# Patient Record
Sex: Female | Born: 1965 | Race: White | Hispanic: No | Marital: Married | Smoking: Former smoker
Health system: Southern US, Community
[De-identification: ages and names within clinical notes are randomized; demographics above are authoritative.]

## PROBLEM LIST (undated history)

## (undated) DIAGNOSIS — E559 Vitamin D deficiency, unspecified: Secondary | ICD-10-CM

## (undated) DIAGNOSIS — E611 Iron deficiency: Secondary | ICD-10-CM

## (undated) DIAGNOSIS — R928 Other abnormal and inconclusive findings on diagnostic imaging of breast: Secondary | ICD-10-CM

## (undated) HISTORY — DX: Other abnormal and inconclusive findings on diagnostic imaging of breast: R92.8

## (undated) HISTORY — DX: Iron deficiency: E61.1

## (undated) HISTORY — DX: Vitamin D deficiency, unspecified: E55.9

## (undated) HISTORY — PX: WISDOM TOOTH EXTRACTION: SHX21

---

## 2006-08-12 ENCOUNTER — Ambulatory Visit: Payer: Self-pay

## 2010-12-04 ENCOUNTER — Ambulatory Visit: Payer: Self-pay | Admitting: Obstetrics and Gynecology

## 2012-01-20 ENCOUNTER — Ambulatory Visit: Payer: Self-pay | Admitting: Obstetrics and Gynecology

## 2012-01-21 ENCOUNTER — Ambulatory Visit: Payer: Self-pay | Admitting: Obstetrics and Gynecology

## 2012-06-22 ENCOUNTER — Telehealth: Payer: Self-pay | Admitting: *Deleted

## 2012-06-22 NOTE — Telephone Encounter (Signed)
Left msg on home vm for patient to call office for results

## 2012-06-22 NOTE — Telephone Encounter (Signed)
Disregard prior note posted

## 2012-12-31 ENCOUNTER — Emergency Department: Payer: Self-pay | Admitting: Emergency Medicine

## 2014-03-22 LAB — LIPID PANEL
Cholesterol: 172 mg/dL (ref 0–200)
HDL: 48 mg/dL (ref 35–70)
LDL Cholesterol: 103 mg/dL
Triglycerides: 106 mg/dL (ref 40–160)

## 2014-03-22 LAB — TSH: TSH: 1.43 u[IU]/mL (ref <=5.90)

## 2014-03-22 LAB — HM PAP SMEAR: HM PAP: NEGATIVE

## 2014-03-22 LAB — HEMOGLOBIN A1C: Hgb A1c MFr Bld: 5.6 % (ref 4.0–6.0)

## 2014-04-24 ENCOUNTER — Ambulatory Visit: Payer: Self-pay | Admitting: Obstetrics and Gynecology

## 2014-05-08 ENCOUNTER — Ambulatory Visit: Payer: Self-pay | Admitting: Obstetrics and Gynecology

## 2014-11-27 ENCOUNTER — Other Ambulatory Visit: Payer: Self-pay | Admitting: Obstetrics and Gynecology

## 2015-03-18 ENCOUNTER — Encounter: Payer: Self-pay | Admitting: *Deleted

## 2015-03-26 ENCOUNTER — Ambulatory Visit (INDEPENDENT_AMBULATORY_CARE_PROVIDER_SITE_OTHER): Payer: BLUE CROSS/BLUE SHIELD | Admitting: Obstetrics and Gynecology

## 2015-03-26 ENCOUNTER — Encounter: Payer: Self-pay | Admitting: Obstetrics and Gynecology

## 2015-03-26 VITALS — BP 120/75 | HR 77 | Wt 143.9 lb

## 2015-03-26 DIAGNOSIS — D649 Anemia, unspecified: Secondary | ICD-10-CM

## 2015-03-26 DIAGNOSIS — Z01419 Encounter for gynecological examination (general) (routine) without abnormal findings: Secondary | ICD-10-CM

## 2015-03-26 DIAGNOSIS — E559 Vitamin D deficiency, unspecified: Secondary | ICD-10-CM

## 2015-03-26 NOTE — Patient Instructions (Signed)
  Place annual gynecologic exam patient instructions here.  Thank you for enrolling in MyChart. Please follow the instructions below to securely access your online medical record. MyChart allows you to send messages to your doctor, view your test results, manage appointments, and more.   How Do I Sign Up? 1. In your Internet browser, go to [employer] and enter [email or web address]. 2. Click on the Sign Up Now link in the Sign In box. You will see the New Member Sign Up page. 3. Enter your MyChart Access Code exactly as it appears below. You will not need to use this code after you've completed the sign-up process. If you do not sign up before the expiration date, you must request a new code.  MyChart Access Code: [email or web address] Expires: 05/25/2015  9:29 AM  4. Enter your Social Security Number ([id number]) and Date of Birth (mm/dd/yyyy) as indicated and click Submit. You will be taken to the next sign-up page. 5. Create a MyChart ID. This will be your MyChart login ID and cannot be changed, so think of one that is secure and easy to remember. 6. Create a MyChart password. You can change your password at any time. 7. Enter your Password Reset Question and Answer. This can be used at a later time if you forget your password.  8. Enter your e-mail address. You will receive e-mail notification when new information is available in MyChart. 9. Click Sign Up. You can now view your medical record.   Additional Information Remember, MyChart is NOT to be used for urgent needs. For medical emergencies, dial 911.

## 2015-03-27 LAB — COMPREHENSIVE METABOLIC PANEL
ALBUMIN: 4.4 g/dL (ref 3.5–5.5)
ALT: 19 IU/L (ref 0–32)
AST: 21 IU/L (ref 0–40)
Albumin/Globulin Ratio: 2.3 (ref 1.1–2.5)
Alkaline Phosphatase: 50 IU/L (ref 39–117)
BILIRUBIN TOTAL: 0.3 mg/dL (ref 0.0–1.2)
BUN / CREAT RATIO: 13 (ref 9–23)
BUN: 11 mg/dL (ref 6–24)
CO2: 24 mmol/L (ref 18–29)
Calcium: 9.3 mg/dL (ref 8.7–10.2)
Chloride: 100 mmol/L (ref 97–106)
Creatinine, Ser: 0.85 mg/dL (ref 0.57–1.00)
GFR calc non Af Amer: 82 mL/min/{1.73_m2} (ref >59)
GFR, EST AFRICAN AMERICAN: 94 mL/min/{1.73_m2} (ref >59)
GLOBULIN, TOTAL: 1.9 g/dL (ref 1.5–4.5)
Glucose: 65 mg/dL (ref 65–99)
POTASSIUM: 4.1 mmol/L (ref 3.5–5.2)
Sodium: 139 mmol/L (ref 136–144)
Total Protein: 6.3 g/dL (ref 6.0–8.5)

## 2015-03-27 LAB — LIPID PANEL
Chol/HDL Ratio: 3.5 ratio units (ref 0.0–4.4)
Cholesterol, Total: 181 mg/dL (ref 100–199)
HDL: 52 mg/dL (ref >39)
LDL Calculated: 109 mg/dL — ABNORMAL HIGH (ref 0–99)
Triglycerides: 101 mg/dL (ref 0–149)
VLDL Cholesterol Cal: 20 mg/dL (ref 5–40)

## 2015-03-27 LAB — CBC
Hematocrit: 41.8 % (ref 34.0–46.6)
Hemoglobin: 14 g/dL (ref 11.1–15.9)
MCH: 29.4 pg (ref 26.6–33.0)
MCHC: 33.5 g/dL (ref 31.5–35.7)
MCV: 88 fL (ref 79–97)
PLATELETS: 168 10*3/uL (ref 150–379)
RBC: 4.76 x10E6/uL (ref 3.77–5.28)
RDW: 13.2 % (ref 12.3–15.4)
WBC: 6.4 10*3/uL (ref 3.4–10.8)

## 2015-03-27 LAB — FERRITIN: Ferritin: 26 ng/mL (ref 15–150)

## 2015-03-27 LAB — VITAMIN D 25 HYDROXY (VIT D DEFICIENCY, FRACTURES): Vit D, 25-Hydroxy: 30.3 ng/mL (ref 30.0–100.0)

## 2015-04-01 ENCOUNTER — Telehealth: Payer: Self-pay | Admitting: *Deleted

## 2015-04-01 NOTE — Telephone Encounter (Signed)
-----   Message from Purcell NailsMelody N Shambley, [place] sent at 03/27/2015 10:46 AM EST ----- Please let her know labs look good and no signs of anemia

## 2015-04-01 NOTE — Telephone Encounter (Signed)
Left detailed message for pt 

## 2015-05-28 NOTE — Progress Notes (Signed)
Subjective:   Christine Watkins is a 50 y.o. G67P0 Caucasian female here for a routine well-woman exam.  Patient's last menstrual period was 03/10/2015 (exact date).    Current complaints: none PCP: ?       Does  desire labs  Social History: Sexual: heterosexual Marital Status: married Living situation: with family Occupation: homemaker Tobacco/alcohol: no tobacco use Illicit drugs: no history of illicit drug use  The following portions of the patient's history were reviewed and updated as appropriate: allergies, current medications, past family history, past medical history, past social history, past surgical history and problem list.  Past Medical History Past Medical History  Diagnosis Date  . Abnormal mammogram   . Vitamin D deficiency   . Iron deficiency     Past Surgical History History reviewed. No pertinent past surgical history.  Gynecologic History G4P0  Patient's last menstrual period was 03/10/2015 (exact date). Contraception: tubal ligation Last Pap: 2015. Results were: normal Last mammogram: 2014. Results were: normal  Obstetric History OB History  Gravida Para Term Preterm AB SAB TAB Ectopic Multiple Living  4         4    # Outcome Date GA Lbr Len/2nd Weight Sex Delivery Anes PTL Lv  4 Gravida 2001    M Vag-Spont   Y  3 Gravida 1999    M Vag-Spont   Y  2 Gravida 1997    F Vag-Spont   Y  1 Gravida 1994    M Vag-Spont   Y      Current Medications No current outpatient prescriptions on file prior to visit.   No current facility-administered medications on file prior to visit.    Review of Systems Patient denies any headaches, blurred vision, shortness of breath, chest pain, abdominal pain, problems with bowel movements, urination, or intercourse.  Objective:  BP 120/75 mmHg  Pulse 77  Wt 143 lb 14.4 oz (65.273 kg)  LMP 03/10/2015 (Exact Date) Physical Exam  General:  Well developed, well nourished, no acute distress. She is alert and oriented  x3. Skin:  Warm and dry Neck:  Midline trachea, no thyromegaly or nodules Cardiovascular: Regular rate and rhythm, no murmur heard Lungs:  Effort normal, all lung fields clear to auscultation bilaterally Breasts:  No dominant palpable mass, retraction, or nipple discharge Abdomen:  Soft, non tender, no hepatosplenomegaly or masses Pelvic:  External genitalia is normal in appearance.  The vagina is normal in appearance. The cervix is bulbous, no CMT.  Thin prep pap is not done  Uterus is felt to be normal size, shape, and contour.  No adnexal masses or tenderness noted. Extremities:  No swelling or varicosities noted Psych:  She has a normal mood and affect  Assessment:   Healthy well-woman exam  Plan:  Labs obtained F/U 1 year for AE, or sooner if needed Mammogram scheduled  Cougar Imel Suzan Nailer, CNM

## 2018-01-20 ENCOUNTER — Other Ambulatory Visit: Payer: Self-pay | Admitting: Obstetrics and Gynecology

## 2018-01-20 DIAGNOSIS — Z1231 Encounter for screening mammogram for malignant neoplasm of breast: Secondary | ICD-10-CM

## 2018-02-11 ENCOUNTER — Ambulatory Visit
Admission: RE | Admit: 2018-02-11 | Discharge: 2018-02-11 | Disposition: A | Discharge status: Home / Self Care | Payer: BLUE CROSS/BLUE SHIELD | Source: Ambulatory Visit | Attending: Obstetrics and Gynecology | Admitting: Obstetrics and Gynecology

## 2018-02-11 DIAGNOSIS — Z1231 Encounter for screening mammogram for malignant neoplasm of breast: Secondary | ICD-10-CM | POA: Insufficient documentation

## 2019-01-02 ENCOUNTER — Other Ambulatory Visit: Payer: Self-pay

## 2019-01-02 DIAGNOSIS — Z20822 Contact with and (suspected) exposure to covid-19: Secondary | ICD-10-CM

## 2019-01-04 LAB — NOVEL CORONAVIRUS, NAA: SARS-CoV-2, NAA: NOT DETECTED

## 2019-05-25 ENCOUNTER — Other Ambulatory Visit: Payer: Self-pay | Admitting: Obstetrics and Gynecology

## 2019-05-25 DIAGNOSIS — Z1231 Encounter for screening mammogram for malignant neoplasm of breast: Secondary | ICD-10-CM

## 2019-05-26 ENCOUNTER — Ambulatory Visit: Payer: BLUE CROSS/BLUE SHIELD | Attending: Internal Medicine

## 2019-05-26 DIAGNOSIS — Z20822 Contact with and (suspected) exposure to covid-19: Secondary | ICD-10-CM

## 2019-05-27 LAB — NOVEL CORONAVIRUS, NAA: SARS-CoV-2, NAA: NOT DETECTED

## 2019-06-16 ENCOUNTER — Ambulatory Visit
Admission: RE | Admit: 2019-06-16 | Discharge: 2019-06-16 | Disposition: A | Discharge status: Home / Self Care | Payer: 59 | Source: Ambulatory Visit | Attending: Obstetrics and Gynecology | Admitting: Obstetrics and Gynecology

## 2019-06-16 DIAGNOSIS — Z1231 Encounter for screening mammogram for malignant neoplasm of breast: Secondary | ICD-10-CM | POA: Diagnosis present

## 2020-06-04 ENCOUNTER — Other Ambulatory Visit: Payer: Self-pay | Admitting: Obstetrics and Gynecology

## 2020-06-04 DIAGNOSIS — Z1231 Encounter for screening mammogram for malignant neoplasm of breast: Secondary | ICD-10-CM

## 2020-06-24 ENCOUNTER — Other Ambulatory Visit: Payer: Self-pay

## 2020-06-24 ENCOUNTER — Ambulatory Visit
Admission: RE | Admit: 2020-06-24 | Discharge: 2020-06-24 | Disposition: A | Discharge status: Home / Self Care | Payer: BC Managed Care – PPO | Source: Ambulatory Visit | Attending: Obstetrics and Gynecology | Admitting: Obstetrics and Gynecology

## 2020-06-24 DIAGNOSIS — Z1231 Encounter for screening mammogram for malignant neoplasm of breast: Secondary | ICD-10-CM | POA: Diagnosis not present

## 2021-05-12 DIAGNOSIS — F5105 Insomnia due to other mental disorder: Secondary | ICD-10-CM | POA: Diagnosis not present

## 2021-05-12 DIAGNOSIS — F4321 Adjustment disorder with depressed mood: Secondary | ICD-10-CM | POA: Diagnosis not present

## 2021-05-12 DIAGNOSIS — R69 Illness, unspecified: Secondary | ICD-10-CM | POA: Diagnosis not present

## 2021-05-13 ENCOUNTER — Other Ambulatory Visit: Payer: Self-pay | Admitting: Physician Assistant

## 2021-05-13 DIAGNOSIS — Z1231 Encounter for screening mammogram for malignant neoplasm of breast: Secondary | ICD-10-CM

## 2021-05-13 DIAGNOSIS — H9313 Tinnitus, bilateral: Secondary | ICD-10-CM | POA: Diagnosis not present

## 2021-05-13 DIAGNOSIS — F5101 Primary insomnia: Secondary | ICD-10-CM | POA: Diagnosis not present

## 2021-05-13 DIAGNOSIS — Z8619 Personal history of other infectious and parasitic diseases: Secondary | ICD-10-CM | POA: Diagnosis not present

## 2021-05-13 DIAGNOSIS — R69 Illness, unspecified: Secondary | ICD-10-CM | POA: Diagnosis not present

## 2021-06-16 DIAGNOSIS — Z1331 Encounter for screening for depression: Secondary | ICD-10-CM | POA: Diagnosis not present

## 2021-06-16 DIAGNOSIS — Z1231 Encounter for screening mammogram for malignant neoplasm of breast: Secondary | ICD-10-CM | POA: Diagnosis not present

## 2021-06-16 DIAGNOSIS — F5105 Insomnia due to other mental disorder: Secondary | ICD-10-CM | POA: Diagnosis not present

## 2021-06-16 DIAGNOSIS — F4321 Adjustment disorder with depressed mood: Secondary | ICD-10-CM | POA: Diagnosis not present

## 2021-06-16 DIAGNOSIS — R69 Illness, unspecified: Secondary | ICD-10-CM | POA: Diagnosis not present

## 2021-06-16 DIAGNOSIS — Z01419 Encounter for gynecological examination (general) (routine) without abnormal findings: Secondary | ICD-10-CM | POA: Diagnosis not present

## 2021-07-08 ENCOUNTER — Other Ambulatory Visit: Payer: Self-pay | Admitting: Obstetrics and Gynecology

## 2021-07-08 DIAGNOSIS — Z1231 Encounter for screening mammogram for malignant neoplasm of breast: Secondary | ICD-10-CM

## 2021-08-04 DIAGNOSIS — Z114 Encounter for screening for human immunodeficiency virus [HIV]: Secondary | ICD-10-CM | POA: Diagnosis not present

## 2021-08-04 DIAGNOSIS — Z0000 Encounter for general adult medical examination without abnormal findings: Secondary | ICD-10-CM | POA: Diagnosis not present

## 2021-08-04 DIAGNOSIS — R69 Illness, unspecified: Secondary | ICD-10-CM | POA: Diagnosis not present

## 2021-08-05 DIAGNOSIS — R7303 Prediabetes: Secondary | ICD-10-CM | POA: Diagnosis not present

## 2021-08-05 DIAGNOSIS — Z0000 Encounter for general adult medical examination without abnormal findings: Secondary | ICD-10-CM | POA: Diagnosis not present

## 2021-08-05 DIAGNOSIS — R69 Illness, unspecified: Secondary | ICD-10-CM | POA: Diagnosis not present

## 2021-08-05 DIAGNOSIS — Z1211 Encounter for screening for malignant neoplasm of colon: Secondary | ICD-10-CM | POA: Diagnosis not present

## 2021-08-13 ENCOUNTER — Ambulatory Visit
Admission: RE | Admit: 2021-08-13 | Discharge: 2021-08-13 | Disposition: A | Discharge status: Home / Self Care | Payer: BC Managed Care – PPO | Source: Ambulatory Visit | Attending: Obstetrics and Gynecology | Admitting: Obstetrics and Gynecology

## 2021-08-13 DIAGNOSIS — Z1231 Encounter for screening mammogram for malignant neoplasm of breast: Secondary | ICD-10-CM | POA: Insufficient documentation

## 2021-09-10 DIAGNOSIS — F4321 Adjustment disorder with depressed mood: Secondary | ICD-10-CM | POA: Diagnosis not present

## 2021-09-10 DIAGNOSIS — R69 Illness, unspecified: Secondary | ICD-10-CM | POA: Diagnosis not present

## 2021-09-10 DIAGNOSIS — F5105 Insomnia due to other mental disorder: Secondary | ICD-10-CM | POA: Diagnosis not present

## 2021-12-10 DIAGNOSIS — F4321 Adjustment disorder with depressed mood: Secondary | ICD-10-CM | POA: Diagnosis not present

## 2021-12-10 DIAGNOSIS — F411 Generalized anxiety disorder: Secondary | ICD-10-CM | POA: Diagnosis not present

## 2021-12-10 DIAGNOSIS — F5105 Insomnia due to other mental disorder: Secondary | ICD-10-CM | POA: Diagnosis not present

## 2022-01-27 DIAGNOSIS — R7303 Prediabetes: Secondary | ICD-10-CM | POA: Diagnosis not present

## 2022-01-27 DIAGNOSIS — Z0000 Encounter for general adult medical examination without abnormal findings: Secondary | ICD-10-CM | POA: Diagnosis not present

## 2022-02-12 DIAGNOSIS — F411 Generalized anxiety disorder: Secondary | ICD-10-CM | POA: Diagnosis not present

## 2022-02-12 DIAGNOSIS — R7303 Prediabetes: Secondary | ICD-10-CM | POA: Diagnosis not present

## 2022-04-08 NOTE — H&P (Signed)
Pre-Procedure H&P   Patient ID: Christine Watkins is a 56 y.o. female.  Gastroenterology Provider: Jaynie Collins, DO  Referring Provider: Maurine Minister, PA PCP: Maurine Minister, PA  Date: 04/09/2022  HPI [name] is a 56 y.o. female who presents today for Colonoscopy for Initial screening colonoscopy.  Patient having regular bowel movements without melena hematochezia diarrhea or constipation.  A1c 5.7 hemoglobin 14.2 MCV 89 platelets 198,000 creatinine 0.8.  No family history of colon cancer or colon polyps.  No other acute GI complaints   Past Medical History:  Diagnosis Date   Abnormal mammogram    Iron deficiency    Vitamin D deficiency     Past Surgical History:  Procedure Laterality Date   WISDOM TOOTH EXTRACTION      Family History No h/o GI disease or malignancy  Review of Systems  Constitutional:  Negative for activity change, appetite change, chills, diaphoresis, fatigue, fever and unexpected weight change.  HENT:  Negative for trouble swallowing and voice change.   Respiratory:  Negative for shortness of breath and wheezing.   Cardiovascular:  Negative for chest pain, palpitations and leg swelling.  Gastrointestinal:  Negative for abdominal distention, abdominal pain, anal bleeding, blood in stool, constipation, diarrhea, nausea, rectal pain and vomiting.  Musculoskeletal:  Negative for arthralgias and myalgias.  Skin:  Negative for color change and pallor.  Neurological:  Negative for dizziness, syncope and weakness.  Psychiatric/Behavioral:  Negative for confusion.   All other systems reviewed and are negative.    Medications No current facility-administered medications on file prior to encounter.   Current Outpatient Medications on File Prior to Encounter  Medication Sig Dispense Refill   cholecalciferol (VITAMIN D) 1000 UNITS tablet Take 1,000 Units by mouth daily.     ferrous fumarate (HEMOCYTE - 106 MG FE) 325 (106 FE)  MG TABS tablet Take 1 tablet by mouth.      Pertinent medications related to GI and procedure were reviewed by me with the patient prior to the procedure   Current Facility-Administered Medications:    0.9 %  sodium chloride infusion, , Intravenous, Continuous, Jaynie Collins, DO, Last Rate: 20 mL/hr at 04/09/22 0717, New Bag at 04/09/22 0717      No Known Allergies Allergies were reviewed by me prior to the procedure  Objective   Body mass index is 27.28 kg/m. Vitals:   04/09/22 0706  BP: 122/80  Pulse: 81  Resp: 18  Temp: (!) 96.7 F (35.9 C)  TempSrc: Temporal  SpO2: 99%  Weight: 69.9 kg  Height: 5\' 3"  (1.6 m)     Physical Exam Vitals and nursing note reviewed.  Constitutional:      General: She is not in acute distress.    Appearance: Normal appearance. She is not ill-appearing, toxic-appearing or diaphoretic.  HENT:     Head: Normocephalic and atraumatic.     Nose: Nose normal.     Mouth/Throat:     Mouth: Mucous membranes are moist.     Pharynx: Oropharynx is clear.  Eyes:     General: No scleral icterus.    Extraocular Movements: Extraocular movements intact.  Cardiovascular:     Rate and Rhythm: Normal rate and regular rhythm.     Heart sounds: Normal heart sounds. No murmur heard.    No friction rub. No gallop.  Pulmonary:     Effort: Pulmonary effort is normal. No respiratory distress.     Breath sounds: Normal breath sounds. No  wheezing, rhonchi or rales.  Abdominal:     General: Bowel sounds are normal. There is no distension.     Palpations: Abdomen is soft.     Tenderness: There is no abdominal tenderness. There is no guarding or rebound.  Musculoskeletal:     Cervical back: Neck supple.     Right lower leg: No edema.     Left lower leg: No edema.  Skin:    General: Skin is warm and dry.     Coloration: Skin is not jaundiced or pale.  Neurological:     General: No focal deficit present.     Mental Status: She is alert and oriented  to person, place, and time. Mental status is at baseline.  Psychiatric:        Mood and Affect: Mood normal.        Behavior: Behavior normal.        Thought Content: Thought content normal.        Judgment: Judgment normal.      Assessment:  [name] is a 56 y.o. female  who presents today for Colonoscopy for  Initial screening colonoscopy.  Plan:  Colonoscopy with possible intervention today  Colonoscopy with possible biopsy, control of bleeding, polypectomy, and interventions as necessary has been discussed with the patient/patient representative. Informed consent was obtained from the patient/patient representative after explaining the indication, nature, and risks of the procedure including but not limited to death, bleeding, perforation, missed neoplasm/lesions, cardiorespiratory compromise, and reaction to medications. Opportunity for questions was given and appropriate answers were provided. Patient/patient representative has verbalized understanding is amenable to undergoing the procedure.   Annamaria Helling, DO  [facility]  Portions of the record may have been created with voice recognition software. Occasional wrong-word or 'sound-a-like' substitutions may have occurred due to the inherent limitations of voice recognition software.  Read the chart carefully and recognize, using context, where substitutions may have occurred.

## 2022-04-09 ENCOUNTER — Ambulatory Visit
Admission: RE | Admit: 2022-04-09 | Discharge: 2022-04-09 | Disposition: A | Discharge status: Home / Self Care | Payer: 59 | Attending: Gastroenterology | Admitting: Gastroenterology

## 2022-04-09 ENCOUNTER — Encounter: Payer: Self-pay | Admitting: Gastroenterology

## 2022-04-09 ENCOUNTER — Ambulatory Visit: Payer: 59 | Admitting: Certified Registered"

## 2022-04-09 ENCOUNTER — Encounter
Admission: RE | Disposition: A | Discharge status: Home / Self Care | Payer: Self-pay | Source: Home / Self Care | Attending: Gastroenterology

## 2022-04-09 ENCOUNTER — Other Ambulatory Visit: Payer: Self-pay

## 2022-04-09 DIAGNOSIS — K649 Unspecified hemorrhoids: Secondary | ICD-10-CM | POA: Diagnosis not present

## 2022-04-09 DIAGNOSIS — D509 Iron deficiency anemia, unspecified: Secondary | ICD-10-CM | POA: Diagnosis not present

## 2022-04-09 DIAGNOSIS — E559 Vitamin D deficiency, unspecified: Secondary | ICD-10-CM | POA: Insufficient documentation

## 2022-04-09 DIAGNOSIS — K641 Second degree hemorrhoids: Secondary | ICD-10-CM | POA: Diagnosis not present

## 2022-04-09 DIAGNOSIS — Z1211 Encounter for screening for malignant neoplasm of colon: Secondary | ICD-10-CM | POA: Insufficient documentation

## 2022-04-09 DIAGNOSIS — Z87891 Personal history of nicotine dependence: Secondary | ICD-10-CM | POA: Diagnosis not present

## 2022-04-09 HISTORY — PX: COLONOSCOPY WITH PROPOFOL: SHX5780

## 2022-04-09 SURGERY — COLONOSCOPY WITH PROPOFOL
Anesthesia: General

## 2022-04-09 MED ORDER — PROPOFOL 1000 MG/100ML IV EMUL
INTRAVENOUS | Status: AC
  Filled 2022-04-09: qty 200

## 2022-04-09 MED ORDER — STERILE WATER FOR IRRIGATION IR SOLN
Status: DC | PRN
  Administered 2022-04-09: 60 mL

## 2022-04-09 MED ORDER — PROPOFOL 500 MG/50ML IV EMUL
INTRAVENOUS | Status: DC | PRN
  Administered 2022-04-09: 120 ug/kg/min via INTRAVENOUS

## 2022-04-09 MED ORDER — PROPOFOL 10 MG/ML IV BOLUS
INTRAVENOUS | Status: DC | PRN
  Administered 2022-04-09: 70 mg via INTRAVENOUS
  Administered 2022-04-09: 30 mg via INTRAVENOUS

## 2022-04-09 MED ORDER — MIDAZOLAM HCL 2 MG/2ML IJ SOLN
INTRAMUSCULAR | Status: AC
  Filled 2022-04-09: qty 2

## 2022-04-09 MED ORDER — SODIUM CHLORIDE 0.9 % IV SOLN: INTRAVENOUS | Status: DC

## 2022-04-09 MED ORDER — LIDOCAINE HCL (PF) 2 % IJ SOLN
INTRAMUSCULAR | Status: AC
  Filled 2022-04-09: qty 5

## 2022-04-09 MED ORDER — LIDOCAINE 2% (20 MG/ML) 5 ML SYRINGE
INTRAMUSCULAR | Status: DC | PRN
  Administered 2022-04-09: 20 mg via INTRAVENOUS

## 2022-04-09 MED ORDER — MIDAZOLAM HCL 5 MG/5ML IJ SOLN
INTRAMUSCULAR | Status: DC | PRN
  Administered 2022-04-09: 2 mg via INTRAVENOUS

## 2022-04-09 NOTE — Anesthesia Postprocedure Evaluation (Signed)
Anesthesia Post Note  Patient: Christine Watkins  Procedure(s) Performed: COLONOSCOPY WITH PROPOFOL  Patient location during evaluation: Endoscopy Anesthesia Type: General Level of consciousness: awake and alert Pain management: pain level controlled Vital Signs Assessment: post-procedure vital signs reviewed and stable Respiratory status: spontaneous breathing, nonlabored ventilation, respiratory function stable and patient connected to nasal cannula oxygen Cardiovascular status: blood pressure returned to baseline and stable Postop Assessment: no apparent nausea or vomiting Anesthetic complications: no   No notable events documented.   Last Vitals:  Vitals:   04/09/22 0824 04/09/22 0834  BP: 101/71 112/81  Pulse: 75 70  Resp:    Temp:    SpO2: 99% 100%    Last Pain:  Vitals:   04/09/22 0834  TempSrc:   PainSc: 0-No pain                 Cleda Mccreedy Emerick Weatherly

## 2022-04-09 NOTE — Anesthesia Preprocedure Evaluation (Signed)
Anesthesia Evaluation  Patient identified by MRN, date of birth, ID band Patient awake    Reviewed: Allergy & Precautions, NPO status , Patient's Chart, lab work & pertinent test results  Airway Mallampati: III  TM Distance: <3 FB Neck ROM: full    Dental  (+) Chipped   Pulmonary neg pulmonary ROS, neg shortness of breath, former smoker   Pulmonary exam normal        Cardiovascular Exercise Tolerance: Good (-) angina negative cardio ROS Normal cardiovascular exam     Neuro/Psych negative neurological ROS  negative psych ROS   GI/Hepatic negative GI ROS, Neg liver ROS,neg GERD  ,,  Endo/Other  negative endocrine ROS    Renal/GU negative Renal ROS  negative genitourinary   Musculoskeletal   Abdominal   Peds  Hematology negative hematology ROS (+)   Anesthesia Other Findings Past Medical History: No date: Abnormal mammogram No date: Iron deficiency No date: Vitamin D deficiency  Past Surgical History: No date: WISDOM TOOTH EXTRACTION  BMI    Body Mass Index: 27.28 kg/m      Reproductive/Obstetrics negative OB ROS                             Anesthesia Physical Anesthesia Plan  ASA: 2  Anesthesia Plan: General   Post-op Pain Management:    Induction: Intravenous  PONV Risk Score and Plan: Propofol infusion and TIVA  Airway Management Planned: Natural Airway and Nasal Cannula  Additional Equipment:   Intra-op Plan:   Post-operative Plan:   Informed Consent: I have reviewed the patients History and Physical, chart, labs and discussed the procedure including the risks, benefits and alternatives for the proposed anesthesia with the patient or authorized representative who has indicated his/her understanding and acceptance.     Dental Advisory Given  Plan Discussed with: Anesthesiologist, CRNA and Surgeon  Anesthesia Plan Comments: (Patient consented for risks of  anesthesia including but not limited to:  - adverse reactions to medications - risk of airway placement if required - damage to eyes, teeth, lips or other oral mucosa - nerve damage due to positioning  - sore throat or hoarseness - Damage to heart, brain, nerves, lungs, other parts of body or loss of life  Patient voiced understanding.)       Anesthesia Quick Evaluation

## 2022-04-09 NOTE — Interval H&P Note (Signed)
History and Physical Interval Note: Preprocedure H&P from 04/09/22  was reviewed and there was no interval change after seeing and examining the patient.  Written consent was obtained from the patient after discussion of risks, benefits, and alternatives. Patient has consented to proceed with Colonoscopy with possible intervention   04/09/2022 7:39 AM  Christine Watkins  has presented today for surgery, with the diagnosis of Z12.11  - Colon cancer screening.  The various methods of treatment have been discussed with the patient and family. After consideration of risks, benefits and other options for treatment, the patient has consented to  Procedure(s): COLONOSCOPY WITH PROPOFOL (N/A) as a surgical intervention.  The patient's history has been reviewed, patient examined, no change in status, stable for surgery.  I have reviewed the patient's chart and labs.  Questions were answered to the patient's satisfaction.     Christine Watkins

## 2022-04-09 NOTE — Op Note (Signed)
[facility] Patient Name: Christine Watkins Procedure Date: 04/09/2022 7:11 AM MRN: [id number] Account #: [id number] Date of Birth: 1966/03/31 Admit Type: Outpatient Age: 56 Room: [facility] ENDO ROOM 1 Gender: Female Note Status: Finalized Instrument Name: Colonoscope [id number] Procedure:             Colonoscopy Indications:           Screening for colorectal malignant neoplasm Providers:             Annamaria Helling DO, DO Referring MD:          Precious Bard, MD (Referring MD) Medicines:             Monitored Anesthesia Care Complications:         No immediate complications. Estimated blood loss: None. Procedure:             Pre-Anesthesia Assessment:                        - Prior to the procedure, a History and Physical was                         performed, and patient medications and allergies were                         reviewed. The patient is competent. The risks and                         benefits of the procedure and the sedation options and                         risks were discussed with the patient. All questions                         were answered and informed consent was obtained.                         Patient identification and proposed procedure were                         verified by the physician, the nurse, the anesthetist                         and the technician in the endoscopy suite. Mental                         Status Examination: alert and oriented. Airway                         Examination: normal oropharyngeal airway and neck                         mobility. Respiratory Examination: clear to                         auscultation. CV Examination: RRR, no murmurs, no S3                         or S4. Prophylactic Antibiotics: The patient does not  require prophylactic antibiotics. Prior                         Anticoagulants: The patient has taken no anticoagulant                          or antiplatelet agents. ASA Grade Assessment: II - A                         patient with mild systemic disease. After reviewing                         the risks and benefits, the patient was deemed in                         satisfactory condition to undergo the procedure. The                         anesthesia plan was to use monitored anesthesia care                         (MAC). Immediately prior to administration of                         medications, the patient was re-assessed for adequacy                         to receive sedatives. The heart rate, respiratory                         rate, oxygen saturations, blood pressure, adequacy of                         pulmonary ventilation, and response to care were                         monitored throughout the procedure. The physical                         status of the patient was re-assessed after the                         procedure.                        After obtaining informed consent, the colonoscope was                         passed under direct vision. Throughout the procedure,                         the patient's blood pressure, pulse, and oxygen                         saturations were monitored continuously. The                         Colonoscope was introduced through the anus and  advanced to the the terminal ileum, with                         identification of the appendiceal orifice and IC                         valve. The colonoscopy was performed without                         difficulty. The patient tolerated the procedure well.                         The quality of the bowel preparation was evaluated                         using the BBPS [facility] Bowel Preparation Scale) with                         scores of: Right Colon = 3, Transverse Colon = 3 and                         Left Colon = 3 (entire mucosa seen well with no                         residual staining, small  fragments of stool or opaque                         liquid). The total BBPS score equals 9. The terminal                         ileum, ileocecal valve, appendiceal orifice, and                         rectum were photographed. Findings:      Hemorrhoids were found on perianal exam.      The digital rectal exam was normal. Pertinent negatives include normal       sphincter tone.      Non-bleeding internal hemorrhoids were found during retroflexion. The       hemorrhoids were Grade II (internal hemorrhoids that prolapse but reduce       spontaneously). Estimated blood loss: none.      Retroflexion in the right colon was performed.      The exam was otherwise without abnormality on direct and retroflexion       views. Impression:            - Hemorrhoids found on perianal exam.                        - Non-bleeding internal hemorrhoids.                        - The examination was otherwise normal on direct and                         retroflexion views.                        - No specimens collected. Recommendation:        -  Patient has a contact number available for                         emergencies. The signs and symptoms of potential                         delayed complications were discussed with the patient.                         Return to normal activities tomorrow. Written                         discharge instructions were provided to the patient.                        - Discharge patient to home.                        - Resume previous diet.                        - Continue present medications.                        - Repeat colonoscopy in 10 years for screening                         purposes.                        - Return to referring physician as previously                         scheduled.                        - The findings and recommendations were discussed with                         the patient. Procedure Code(s):     --- Professional ---                         [phone], Colonoscopy, flexible; diagnostic, including                         collection of specimen(s) by brushing or washing, when                         performed (separate procedure) Diagnosis Code(s):     --- Professional ---                        Z12.11, Encounter for screening for malignant neoplasm                         of colon                        K64.1, Second degree hemorrhoids CPT copyright 2022 American Medical Association. All rights reserved. The codes documented in this report are preliminary and upon coder review may  be revised to meet current compliance requirements. Attending  Participation:      I personally performed the entire procedure. Volney American, DO Annamaria Helling DO, DO 04/09/2022 8:15:52 AM This report has been signed electronically. Number of Addenda: 0 Note Initiated On: 04/09/2022 7:11 AM Scope Withdrawal Time: 0 hours 13 minutes 22 seconds  Total Procedure Duration: 0 hours 24 minutes 8 seconds  Estimated Blood Loss:  Estimated blood loss: none.      [facility]

## 2022-04-09 NOTE — Transfer of Care (Signed)
Immediate Anesthesia Transfer of Care Note  Patient: SHAINE MOUNT  Procedure(s) Performed: COLONOSCOPY WITH PROPOFOL  Patient Location: PACU  Anesthesia Type:General  Level of Consciousness: drowsy  Airway & Oxygen Therapy: Patient Spontanous Breathing  Post-op Assessment: Report given to RN and Post -op Vital signs reviewed and stable  Post vital signs: Reviewed  Last Vitals:  Vitals Value Taken Time  BP 87/58 04/09/22 0815  Temp 36.1 C 04/09/22 0814  Pulse 72 04/09/22 0815  Resp 19 04/09/22 0815  SpO2 98 % 04/09/22 0815  Vitals shown include unvalidated device data.  Last Pain:  Vitals:   04/09/22 0814  TempSrc: Temporal  PainSc: Asleep         Complications: No notable events documented.

## 2022-04-10 ENCOUNTER — Encounter: Payer: Self-pay | Admitting: Gastroenterology

## 2022-08-18 DIAGNOSIS — R7303 Prediabetes: Secondary | ICD-10-CM | POA: Diagnosis not present

## 2022-08-18 DIAGNOSIS — Z13220 Encounter for screening for lipoid disorders: Secondary | ICD-10-CM | POA: Diagnosis not present

## 2022-08-18 DIAGNOSIS — Z0000 Encounter for general adult medical examination without abnormal findings: Secondary | ICD-10-CM | POA: Diagnosis not present

## 2022-08-25 DIAGNOSIS — F411 Generalized anxiety disorder: Secondary | ICD-10-CM | POA: Diagnosis not present

## 2022-08-25 DIAGNOSIS — Z1231 Encounter for screening mammogram for malignant neoplasm of breast: Secondary | ICD-10-CM | POA: Diagnosis not present

## 2022-08-25 DIAGNOSIS — R7303 Prediabetes: Secondary | ICD-10-CM | POA: Diagnosis not present

## 2022-08-25 DIAGNOSIS — Z0000 Encounter for general adult medical examination without abnormal findings: Secondary | ICD-10-CM | POA: Diagnosis not present

## 2022-08-26 ENCOUNTER — Other Ambulatory Visit: Payer: Self-pay | Admitting: Physician Assistant

## 2022-08-26 DIAGNOSIS — Z1231 Encounter for screening mammogram for malignant neoplasm of breast: Secondary | ICD-10-CM

## 2022-09-21 ENCOUNTER — Ambulatory Visit
Admission: RE | Admit: 2022-09-21 | Discharge: 2022-09-21 | Disposition: A | Discharge status: Home / Self Care | Payer: 59 | Source: Ambulatory Visit | Attending: Physician Assistant | Admitting: Physician Assistant

## 2022-09-21 DIAGNOSIS — Z1231 Encounter for screening mammogram for malignant neoplasm of breast: Secondary | ICD-10-CM

## 2022-12-09 DIAGNOSIS — Z20822 Contact with and (suspected) exposure to covid-19: Secondary | ICD-10-CM | POA: Diagnosis not present

## 2022-12-09 DIAGNOSIS — H9203 Otalgia, bilateral: Secondary | ICD-10-CM | POA: Diagnosis not present

## 2023-02-23 DIAGNOSIS — Z0000 Encounter for general adult medical examination without abnormal findings: Secondary | ICD-10-CM | POA: Diagnosis not present

## 2023-02-23 DIAGNOSIS — Z13220 Encounter for screening for lipoid disorders: Secondary | ICD-10-CM | POA: Diagnosis not present

## 2023-02-23 DIAGNOSIS — R7303 Prediabetes: Secondary | ICD-10-CM | POA: Diagnosis not present

## 2023-03-02 DIAGNOSIS — R7303 Prediabetes: Secondary | ICD-10-CM | POA: Diagnosis not present

## 2023-03-02 DIAGNOSIS — F411 Generalized anxiety disorder: Secondary | ICD-10-CM | POA: Diagnosis not present

## 2023-08-07 IMAGING — MG MM DIGITAL SCREENING BILAT W/ TOMO AND CAD
8 series · 9 of 24 positions shown · non-contrast
Comparison: Previous exam(s).

CLINICAL DATA: Screening.

EXAM:
DIGITAL SCREENING BILATERAL MAMMOGRAM WITH TOMOSYNTHESIS AND CAD
TECHNIQUE: Bilateral screening digital craniocaudal and mediolateral oblique
mammograms were obtained. Bilateral screening digital breast
tomosynthesis was performed. The images were evaluated with
computer-aided detection.

[R CC synth-2D]
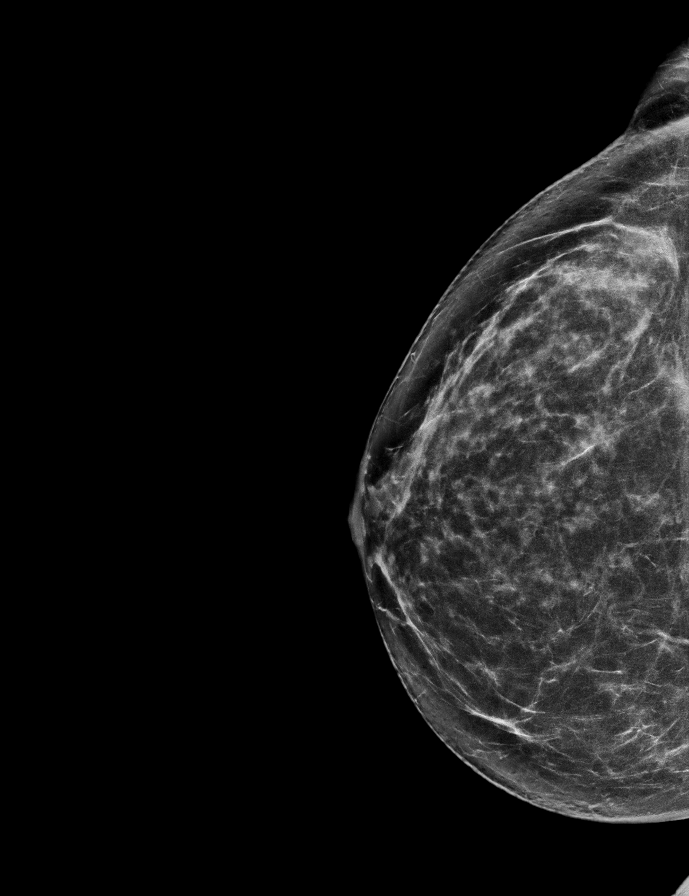

[L MLO synth-2D]
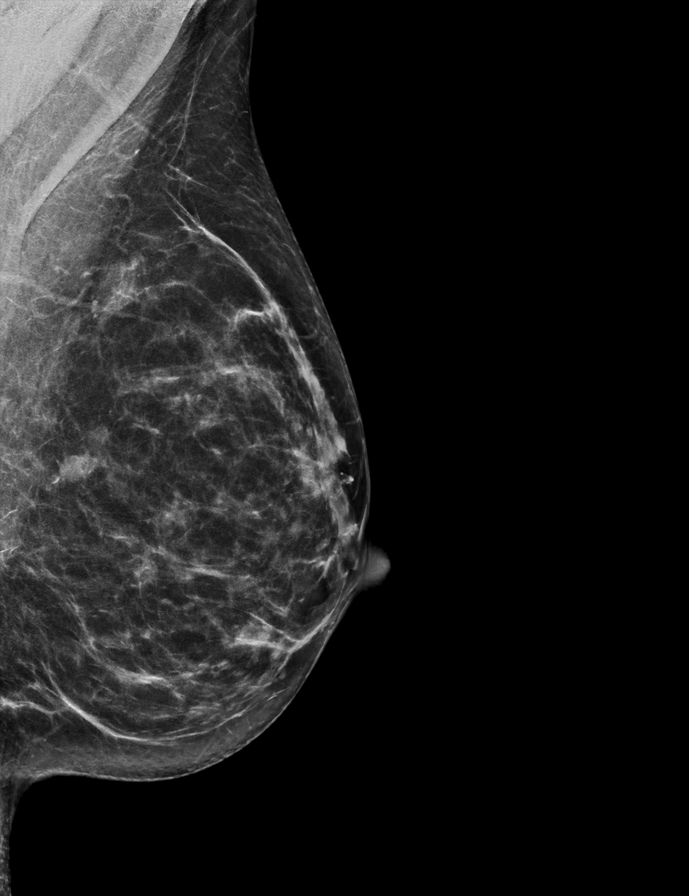

[L CC synth-2D]
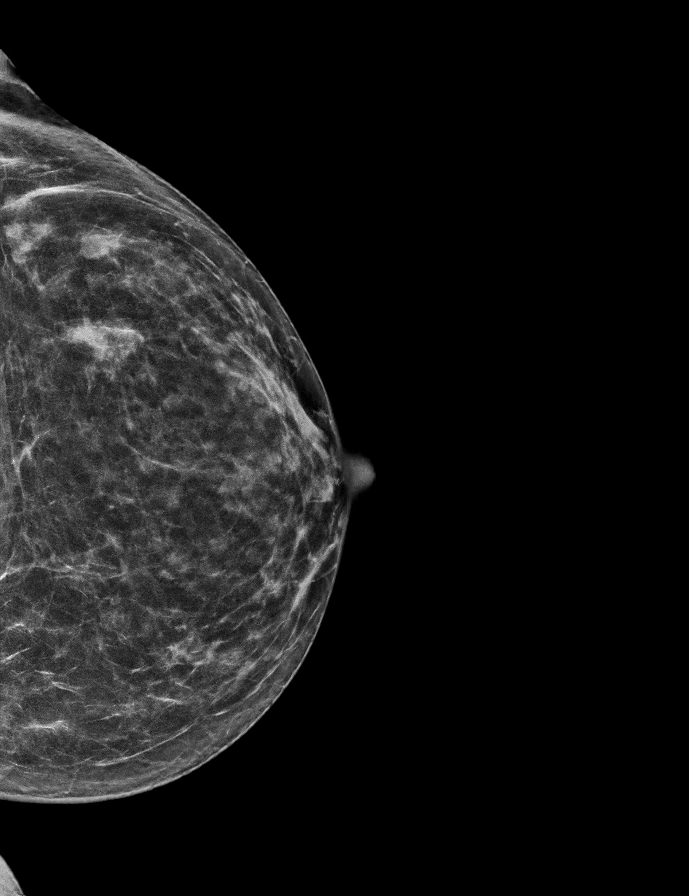

[R MLO synth-2D]
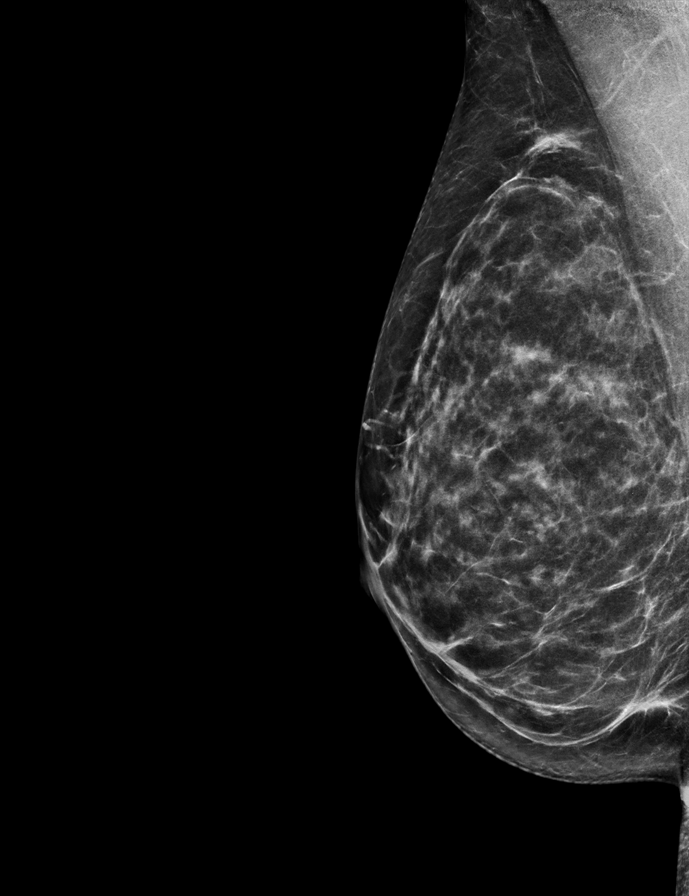

[L CC tomo · 2 of 54 frames shown]
[frame 18/54]
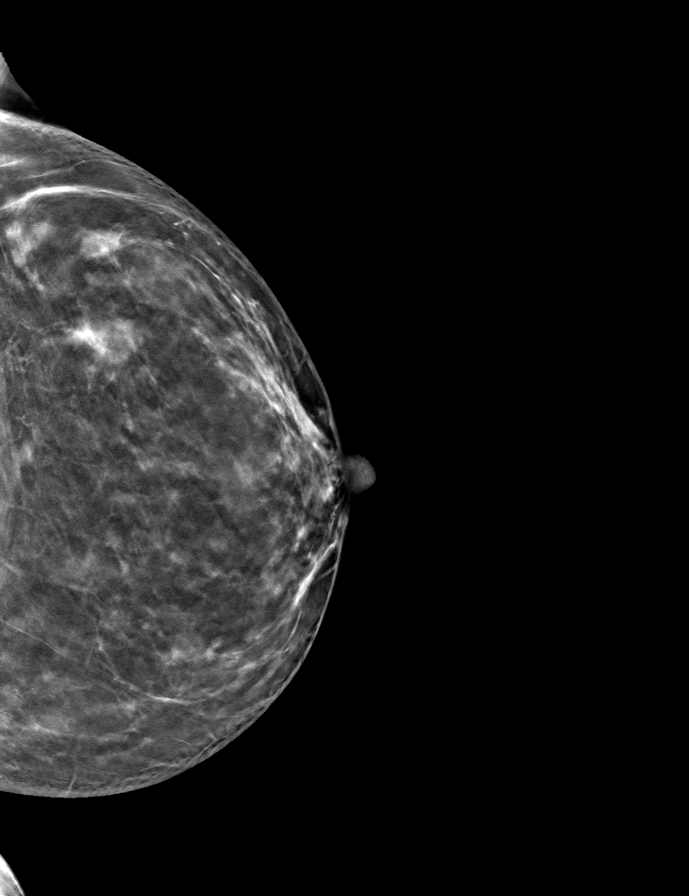
[frame 27/54]
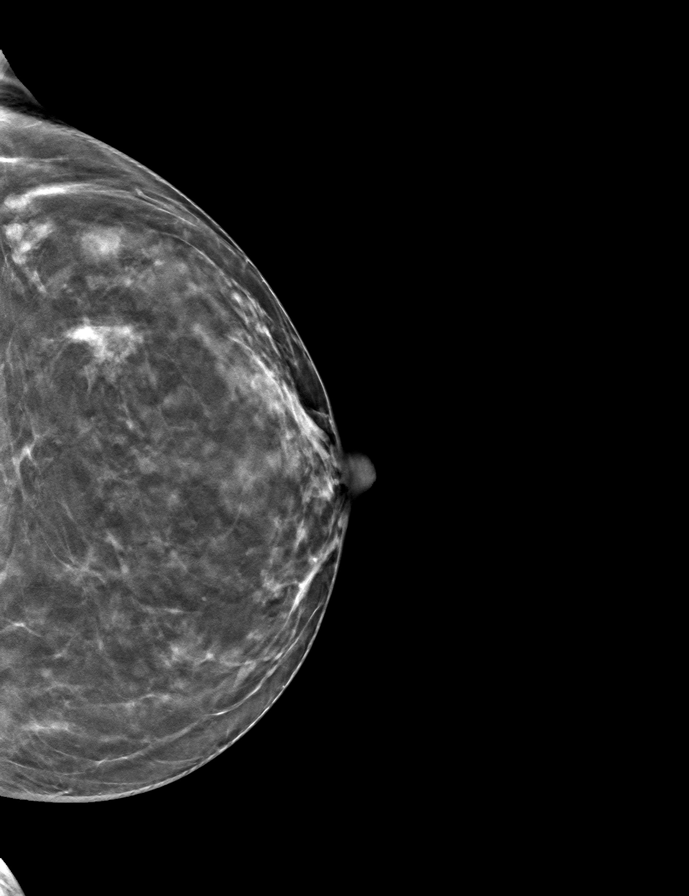

[R MLO tomo · tomo slice 31/60.0]
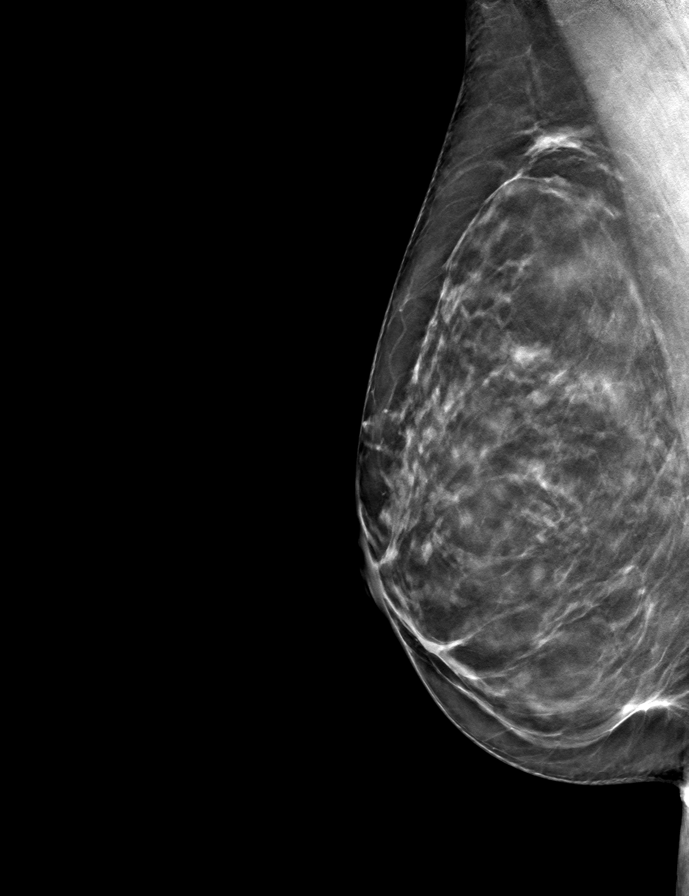

[R CC tomo · tomo slice 33/64.0]
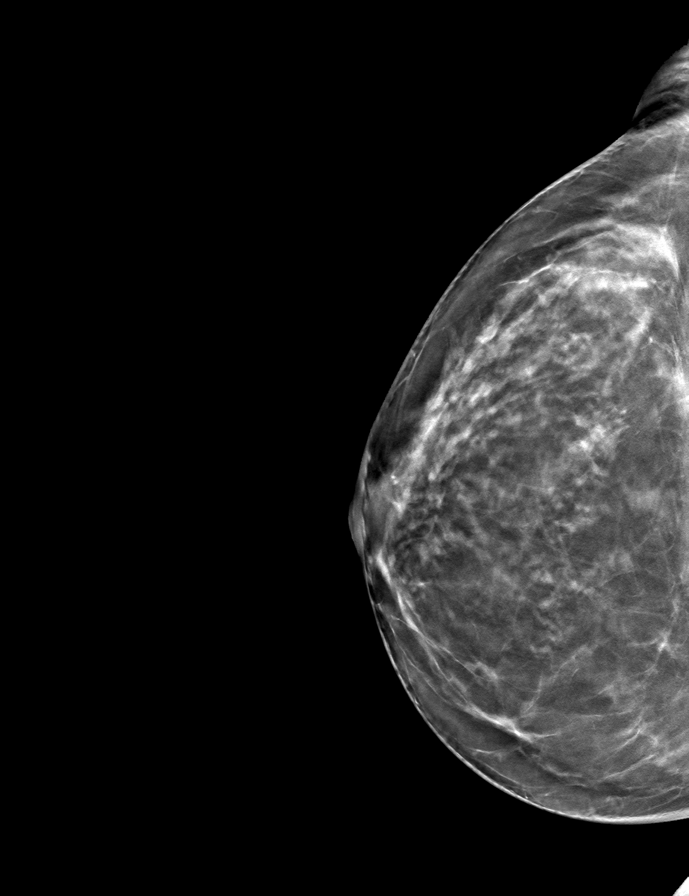

[L MLO tomo · tomo slice 30/59.0]
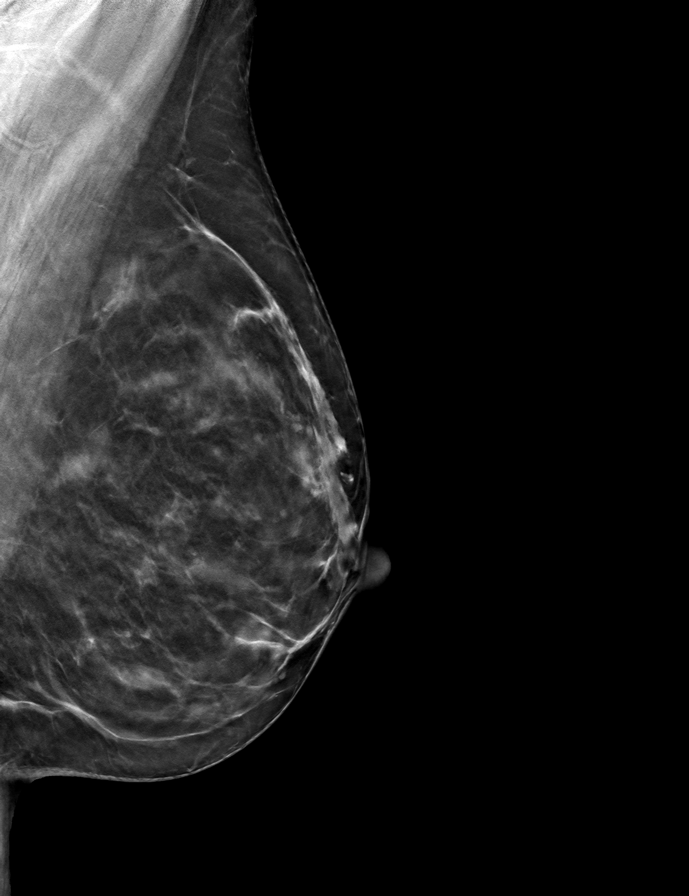

[9 of 24 positions shown; findings below may reference images not displayed]

ACR Breast Density Category b: There are scattered areas of
fibroglandular density.
FINDINGS: There are no findings suspicious for malignancy.
IMPRESSION: No mammographic evidence of malignancy. A result letter of this
screening mammogram will be mailed directly to the patient.

RECOMMENDATION:
Screening mammogram in one year. (Code:51-O-LD2)

BI-RADS CATEGORY  1: Negative.

## 2023-08-24 DIAGNOSIS — Z13220 Encounter for screening for lipoid disorders: Secondary | ICD-10-CM | POA: Diagnosis not present

## 2023-08-24 DIAGNOSIS — R7303 Prediabetes: Secondary | ICD-10-CM | POA: Diagnosis not present

## 2023-08-24 DIAGNOSIS — F411 Generalized anxiety disorder: Secondary | ICD-10-CM | POA: Diagnosis not present

## 2023-08-24 DIAGNOSIS — Z0000 Encounter for general adult medical examination without abnormal findings: Secondary | ICD-10-CM | POA: Diagnosis not present

## 2023-08-31 ENCOUNTER — Other Ambulatory Visit: Payer: Self-pay | Admitting: Physician Assistant

## 2023-08-31 DIAGNOSIS — F411 Generalized anxiety disorder: Secondary | ICD-10-CM | POA: Diagnosis not present

## 2023-08-31 DIAGNOSIS — Z1231 Encounter for screening mammogram for malignant neoplasm of breast: Secondary | ICD-10-CM

## 2023-08-31 DIAGNOSIS — Z0000 Encounter for general adult medical examination without abnormal findings: Secondary | ICD-10-CM | POA: Diagnosis not present

## 2023-08-31 DIAGNOSIS — R7303 Prediabetes: Secondary | ICD-10-CM | POA: Diagnosis not present

## 2023-09-23 ENCOUNTER — Ambulatory Visit
Admission: RE | Admit: 2023-09-23 | Discharge: 2023-09-23 | Disposition: A | Discharge status: Home / Self Care | Source: Ambulatory Visit | Attending: Physician Assistant | Admitting: Physician Assistant

## 2023-09-23 DIAGNOSIS — Z1231 Encounter for screening mammogram for malignant neoplasm of breast: Secondary | ICD-10-CM | POA: Diagnosis not present

## 2024-03-01 DIAGNOSIS — Z0000 Encounter for general adult medical examination without abnormal findings: Secondary | ICD-10-CM | POA: Diagnosis not present

## 2024-03-01 DIAGNOSIS — R7303 Prediabetes: Secondary | ICD-10-CM | POA: Diagnosis not present

## 2024-03-08 DIAGNOSIS — F411 Generalized anxiety disorder: Secondary | ICD-10-CM | POA: Diagnosis not present

## 2024-03-08 DIAGNOSIS — R7303 Prediabetes: Secondary | ICD-10-CM | POA: Diagnosis not present
